# Patient Record
Sex: Female | Born: 1985 | Race: Black or African American | Hispanic: No | Marital: Single | Smoking: Never smoker
Health system: Southern US, Community
[De-identification: ages and names within clinical notes are randomized; demographics above are authoritative.]

---

## 2008-01-25 ENCOUNTER — Other Ambulatory Visit: Admission: RE | Admit: 2008-01-25 | Discharge: 2008-01-25 | Payer: Self-pay | Admitting: Obstetrics and Gynecology

## 2008-02-20 ENCOUNTER — Emergency Department (HOSPITAL_COMMUNITY): Admission: EM | Admit: 2008-02-20 | Discharge: 2008-02-20 | Payer: Self-pay | Admitting: *Deleted
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
## 2015-10-13 ENCOUNTER — Emergency Department: Payer: BC Managed Care – PPO

## 2015-10-13 ENCOUNTER — Emergency Department
Admission: EM | Admit: 2015-10-13 | Discharge: 2015-10-13 | Disposition: A | Discharge status: Home / Self Care | Payer: BC Managed Care – PPO | Attending: Emergency Medicine | Admitting: Emergency Medicine

## 2015-10-13 DIAGNOSIS — M65221 Calcific tendinitis, right upper arm: Secondary | ICD-10-CM | POA: Insufficient documentation

## 2015-10-13 LAB — CBC AND DIFFERENTIAL
Basophils Absolute Automated: 0.02 10*3/uL (ref 0.00–0.20)
Basophils Automated: 0 %
Eosinophils Absolute Automated: 0.08 10*3/uL (ref 0.00–0.70)
Eosinophils Automated: 2 %
Hematocrit: 44 % (ref 37.0–47.0)
Hgb: 14.9 g/dL (ref 12.0–16.0)
Immature Granulocytes Absolute: 0.01 10*3/uL
Immature Granulocytes: 0 %
Lymphocytes Absolute Automated: 2.1 10*3/uL (ref 0.50–4.40)
Lymphocytes Automated: 41 %
MCH: 29.6 pg (ref 28.0–32.0)
MCHC: 33.9 g/dL (ref 32.0–36.0)
MCV: 87.3 fL (ref 80.0–100.0)
MPV: 12.4 fL — ABNORMAL HIGH (ref 9.4–12.3)
Monocytes Absolute Automated: 0.6 10*3/uL (ref 0.00–1.20)
Monocytes: 12 %
Neutrophils Absolute: 2.28 10*3/uL (ref 1.80–8.10)
Neutrophils: 45 %
Nucleated RBC: 0 /100 WBC (ref 0–1)
Platelets: 228 10*3/uL (ref 140–400)
RBC: 5.04 10*6/uL (ref 4.20–5.40)
RDW: 13 % (ref 12–15)
WBC: 5.08 10*3/uL (ref 3.50–10.80)

## 2015-10-13 LAB — BASIC METABOLIC PANEL
Anion Gap: 14 (ref 5.0–15.0)
BUN: 9 mg/dL (ref 7–19)
CO2: 19 mEq/L — ABNORMAL LOW (ref 22–29)
Calcium: 9.3 mg/dL (ref 8.5–10.5)
Chloride: 110 mEq/L (ref 100–111)
Creatinine: 1 mg/dL (ref 0.6–1.0)
Glucose: 98 mg/dL (ref 70–100)
Potassium: 4.3 mEq/L (ref 3.5–5.1)
Sodium: 143 mEq/L (ref 136–145)

## 2015-10-13 LAB — GFR: EGFR: >60

## 2015-10-13 LAB — SEDIMENTATION RATE: Sed Rate: 12 mm/Hr (ref 0–20)

## 2015-10-13 LAB — URIC ACID: Uric acid: 3.9 mg/dL (ref 2.6–6.0)

## 2015-10-13 MED ORDER — TRAMADOL HCL 50 MG PO TABS
50.0000 mg | ORAL_TABLET | Freq: Four times a day (QID) | ORAL | Status: DC | PRN
Start: 2015-10-13 — End: 2017-10-07

## 2015-10-13 MED ORDER — TRAMADOL HCL 50 MG PO TABS
50.0000 mg | ORAL_TABLET | Freq: Once | ORAL | Status: AC
Start: 2015-10-13 — End: 2015-10-13
  Administered 2015-10-13: 50 mg via ORAL
  Filled 2015-10-13: qty 1

## 2015-10-13 NOTE — Discharge Instructions (Signed)
Dear Leah Garner:    I appreciate your choosing the [facility] for your healthcare needs, and hope your visit today was EXCELLENT.    Instructions:  Please follow-up with Dr. Leavy Cella (orthopedist) in 2-3 days.     Return to the Emergency Department for any worsening symptoms or concerns.    Below is some information that our patients often find helpful.    We wish you good health and please do not hesitate to contact us if we can ever be of any assistance.    Sincerely,  Dorethea Clan, MD  [facility]    ________________________________________________________________    If you do not continue to improve or your condition worsens, please contact your doctor or return immediately to the Emergency Department.    Thank you for choosing [facility] for your emergency care needs.  We strive to provide EXCELLENT care to you and your family.      DOCTOR REFERRALS  Call [phone] if you need any further referrals and we can help you find a primary care doctor or specialist.  Also, available online at:  [email or web address]    YOUR CONTACT INFORMATION  Before leaving please check with registration to make sure we have an up-to-date contact number.  You can call registration at [phone] to update your information.  For questions about your hospital bill, please call [phone].  For questions about your Emergency Dept Physician bill please call [phone].      FREE HEALTH SERVICES  If you need help with health or social services, please call 2-1-1 for a free referral to resources in your area.  2-1-1 is a free service connecting people with information on health insurance, free clinics, pregnancy, mental health, dental care, food assistance, housing, and substance abuse counseling.  Also, available online at:  http://www.[facility].org    MEDICAL RECORDS AND TESTS  Certain laboratory test results do not come back the  same day, for example urine cultures.   We will contact you if other important findings are noted.  Radiology films are often reviewed again to ensure accuracy.  If there is any discrepancy, we will notify you.      Please call [phone] to pick up a complimentary CD of any radiology studies performed.  If you or your doctor would like to request a copy of your medical records, please call [phone].      ORTHOPEDIC INJURY   Please know that significant injuries can exist even when an initial x-ray is read as normal or negative.  This can occur because some fractures (broken bones) are not initially visible on x-rays.  For this reason, close outpatient follow-up with your primary care doctor or bone specialist (orthopedist) is required.    MEDICATIONS AND FOLLOWUP  Please be aware that some prescription medications can cause drowsiness.  Use caution when driving or operating machinery.    The examination and treatment you have received in our Emergency Department is provided on an emergency basis, and is not intended to be a substitute for your primary care physician.  It is important that your doctor checks you again and that you report any new or remaining problems at that time.      24 HOUR PHARMACIES  [facility] - [address] [place], [place] [address] (1.4 miles, 7 minutes)  [facility] - [address] [place], [place] [address] (6.5 miles, 13 minutes)  Handout with directions  available on request

## 2015-10-13 NOTE — ED Notes (Signed)
Pt c/o increasing right elbow pain since Friday. Denies any injury. Pt states took a muscle relaxant with no improvement.

## 2015-10-13 NOTE — ED Provider Notes (Signed)
Physician/Midlevel provider first contact with patient: 10/13/15 1808         [facility] EMERGENCY DEPARTMENT HISTORY AND PHYSICAL EXAM    Patient Name: Leah Garner, Leah Garner  Encounter Date:  10/13/2015  Rendering Provider: Dorethea Clan, MD  Patient DOB:  11-Dec-1985  MRN:  [id number]    History of Presenting Illness     Historian: Patient    30 y.o. female nonsmoker o/w healthy p/w progressively worsening right elbow pain for the past 2 days, worsening significantly today.  She first noticed the pain when she woke from sleep 2 days ago.  She initially thought she had slept on it wrong, but pain has persisted and worsened.  Pain is worse with movement, especially bending at the elbow.  She took a muscle relaxer PTA w/o relief.  No known injury. Patient is on hormonal birth control.  Her aunt has a h/o gout.      PMD:  Pcp, [facility], MD    Past Medical History     History reviewed. No pertinent past medical history.    Past Surgical History     History reviewed. No pertinent past surgical history.    Family History     No family history on file.    Social History     Social History     Social History   . Marital Status: Single     Spouse Name: N/A   . Number of Children: N/A   . Years of Education: N/A     Social History Main Topics   . Smoking status: Never Smoker    . Smokeless tobacco: Not on file   . Alcohol Use: Yes      Comment: socially   . Drug Use: Not on file   . Sexual Activity: Not on file     Other Topics Concern   . Not on file     Social History Narrative   . No narrative on file       Home Medications     Home medications reviewed by ED MD     Previous Medications    DESOGESTREL-ETHINYL ESTRADIOL (KARIVA) 0.15-0.02/0.01 MG (21/5) PER TABLET    Take 1 tablet by mouth daily.         Review of Systems     MS:  +Right elbow pain  All other systems reviewed and negative    Physical Exam     BP 138/84 mmHg  Pulse 103  Temp(Src) 98.9 F (37.2 C)  Resp 18  Ht 5\' 6"  (1.676 m)  Wt 104.327 kg   BMI 37.14 kg/m2  SpO2 100%  LMP 10/04/2015 (Exact Date)    CONSTITUTIONAL   Vital signs reviewed, Well appearing, Patient appears comfortable.  Alert and oriented X 3, Patient has no pain distress.  HEAD   Atraumatic, Normocephalic.  UPPER EXTREMITY  Left elbow tenderness. ?Slight increase in warmth. Pain with flexion.  NEURO  No focal motor deficits, Speech normal. Cranial nerves grossly normal.  SKIN   Skin is warm, Skin is dry, Skin is normal color.    ED Medications Administered     ED Medication Orders     None          Orders Placed During This Encounter     Orders Placed This Encounter   Procedures   . Splint- Upper Extremity   . Elbow Right AP Lateral and Obliques   . Basic Metabolic Panel (BMP)   . CBC with Differential   .  Sedimentation rate (ESR)   . Uric acid   . GFR       Diagnostic Study Results     The results of the diagnostic studies below were reviewed by the ED provider:    Labs  Results     Procedure Component Value Units Date/Time    GFR [[id number]] Collected:  10/13/15 1913     EGFR >60.0 Updated:  10/13/15 1935    Basic Metabolic Panel (BMP) [[id number]]  (Abnormal) Collected:  10/13/15 1913    Specimen Information:  Blood Updated:  10/13/15 1935     Glucose 98 mg/dL      BUN 9 mg/dL      Creatinine 1.0 mg/dL      Calcium 9.3 mg/dL      Sodium 621 mEq/L      Potassium 4.3 mEq/L      Chloride 110 mEq/L      CO2 19 (L) mEq/L      Anion Gap 14.0     Uric acid [[id number]] Collected:  10/13/15 1913    Specimen Information:  Blood Updated:  10/13/15 1935     Uric acid 3.9 mg/dL     CBC with Differential [[id number]]  (Abnormal) Collected:  10/13/15 1913    Specimen Information:  Blood from Blood Updated:  10/13/15 1919     WBC 5.08 x10 3/uL      Hgb 14.9 g/dL      Hematocrit 32.4 %      Platelets 228 x10 3/uL      RBC 5.04 x10 6/uL      MCV 87.3 fL      MCH 29.6 pg      MCHC 33.9 g/dL      RDW 13 %      MPV 12.4 (H) fL      Neutrophils 45 %      Lymphocytes Automated 41 %      Monocytes 12 %       Eosinophils Automated 2 %      Basophils Automated 0 %      Immature Granulocyte 0 %      Nucleated RBC 0 /100 WBC      Neutrophils Absolute 2.28 x10 3/uL      Abs Lymph Automated 2.10 x10 3/uL      Abs Mono Automated 0.60 x10 3/uL      Abs Eos Automated 0.08 x10 3/uL      Absolute Baso Automated 0.02 x10 3/uL      Absolute Immature Granulocyte 0.01 x10 3/uL     Sedimentation rate (ESR) [[id number]] Collected:  10/13/15 1913    Specimen Information:  Blood Updated:  10/13/15 1918          Radiologic Studies  Radiology Results (24 Hour)     Procedure Component Value Units Date/Time    Elbow Right AP Lateral and Obliques [[id number]] Collected:  10/13/15 1956    Order Status:  Completed Updated:  10/13/15 2001    Narrative:      History: Right elbow pain.    AP, lateral, and oblique views of the right elbow demonstrate no  fracture, dislocation, effusion, or significant bone lesion. There is a  small amount of punctate calcification within the soft tissues of the  olecranon this could indicate calcific tendinitis.    Joint spaces preserved.      Impression:       Calcification within the soft tissues at the tip of the  olecranon possible calcific tendinitis.  Otherwise unremarkable.        Leah Rakes, MD   10/13/2015 7:57 PM            Scribe and MD Attestations     I, Leah Clan, MD, personally performed the services documented. Leah Garner is scribing for me on Leah Garner. I reviewed and confirm the accuracy of the information in this medical record.    I, Leah [employer], am serving as a Neurosurgeon to document services personally performed by Leah Clan, MD, based on the provider's statements to me.     Credentials: Leah Garner, scribe    Rendering Provider: Dorethea Clan, MD    Monitors, EKG, Critical Care, and Splints     EKG (interpreted by ED physician):     Critical Care:   Splint check:      MDM and Clinical Notes     Notes:    Consults:    Diagnosis and  Disposition     Clinical Impression  1. Calcific tendonitis of right upper arm        Disposition  ED Disposition     Discharge Buel Ream discharge to home/self care.    Condition at disposition: Stable            Prescriptions       New Prescriptions    TRAMADOL (ULTRAM) 50 MG TABLET    Take 1 tablet (50 mg total) by mouth every 6 (six) hours as needed for Pain. Do not drive or operate machinery while taking this medication               Leah Clan, MD  10/14/15 2358

## 2019-09-30 ENCOUNTER — Encounter (INDEPENDENT_AMBULATORY_CARE_PROVIDER_SITE_OTHER): Payer: Self-pay

## 2019-10-05 ENCOUNTER — Ambulatory Visit (INDEPENDENT_AMBULATORY_CARE_PROVIDER_SITE_OTHER): Payer: BC Managed Care – PPO

## 2019-10-05 DIAGNOSIS — Z23 Encounter for immunization: Secondary | ICD-10-CM

## 2019-10-26 ENCOUNTER — Encounter (INDEPENDENT_AMBULATORY_CARE_PROVIDER_SITE_OTHER): Payer: Self-pay

## 2019-11-01 ENCOUNTER — Ambulatory Visit (INDEPENDENT_AMBULATORY_CARE_PROVIDER_SITE_OTHER): Payer: BC Managed Care – PPO

## 2019-11-01 DIAGNOSIS — Z23 Encounter for immunization: Secondary | ICD-10-CM

## 2022-07-27 ENCOUNTER — Ambulatory Visit
Admission: EM | Admit: 2022-07-27 | Discharge: 2022-07-27 | Disposition: A | Discharge status: Home / Self Care | Payer: BC Managed Care – PPO | Attending: Emergency Medicine | Admitting: Emergency Medicine

## 2022-07-27 DIAGNOSIS — N760 Acute vaginitis: Secondary | ICD-10-CM | POA: Diagnosis not present

## 2022-07-27 DIAGNOSIS — B9689 Other specified bacterial agents as the cause of diseases classified elsewhere: Secondary | ICD-10-CM | POA: Diagnosis not present

## 2022-07-27 DIAGNOSIS — Z113 Encounter for screening for infections with a predominantly sexual mode of transmission: Secondary | ICD-10-CM | POA: Insufficient documentation

## 2022-07-27 DIAGNOSIS — Z3202 Encounter for pregnancy test, result negative: Secondary | ICD-10-CM

## 2022-07-27 DIAGNOSIS — R300 Dysuria: Secondary | ICD-10-CM | POA: Diagnosis present

## 2022-07-27 DIAGNOSIS — R350 Frequency of micturition: Secondary | ICD-10-CM | POA: Diagnosis not present

## 2022-07-27 DIAGNOSIS — R1030 Lower abdominal pain, unspecified: Secondary | ICD-10-CM | POA: Insufficient documentation

## 2022-07-27 LAB — POCT URINALYSIS DIP (MANUAL ENTRY)
Bilirubin, UA: NEGATIVE
Glucose, UA: NEGATIVE mg/dL
Ketones, POC UA: NEGATIVE mg/dL
Nitrite, UA: NEGATIVE
Protein Ur, POC: 30 mg/dL — AB
Spec Grav, UA: 1.015 (ref 1.010–1.025)
Urobilinogen, UA: 0.2 E.U./dL
pH, UA: 7 (ref 5.0–8.0)

## 2022-07-27 LAB — POCT URINE PREGNANCY: Preg Test, Ur: NEGATIVE

## 2022-07-27 MED ORDER — NITROFURANTOIN MONOHYD MACRO 100 MG PO CAPS: 100.0000 mg | ORAL_CAPSULE | Freq: Two times a day (BID) | ORAL | 0 refills | Status: AC

## 2022-07-27 NOTE — Discharge Instructions (Addendum)
Take the antibiotic as directed.  The urine culture is pending.  We will call you if it shows the need to change or discontinue your antibiotic.   ° °Your vaginal tests are pending.  If your test results are positive, we will call you.  Do not have sexual activity for at least 7 days.   ° °Follow up with your primary care provider if your symptoms are not improving.   ° °

## 2022-07-27 NOTE — ED Triage Notes (Addendum)
Patient to Urgent Care with complaints of strong smelling urine and urinary frequency. Reports some urinary discomfort. Symptoms started four days ago.   Denies any known fevers.

## 2022-07-27 NOTE — ED Provider Notes (Signed)
Roderic Palau    CSN: [id number] Arrival date & time: 07/27/22  1553      History   Chief Complaint Chief Complaint  Patient presents with   Dysuria    HPI Becky Contreras is a 37 y.o. female.  Patient presents with malodorous urine, dysuria, urinary frequency, lower abdominal cramping x 4 days.  No fever, chills, abdominal pain, hematuria, flank pain, vaginal discharge, pelvic pain, or other symptoms.  No treatment at home.  Patient also requests vaginal testing.  LMP: 07/23/2022.   The history is provided by the patient and medical records.    History reviewed. No pertinent past medical history.  There are no problems to display for this patient.   History reviewed. No pertinent surgical history.  OB History   No obstetric history on file.      Home Medications    Prior to Admission medications   Medication Sig Start Date End Date Taking? Authorizing Provider  BLISOVI 24 FE 1-20 MG-MCG(24) tablet Take 1 tablet by mouth daily. 07/22/22  Yes [provider]  nitrofurantoin, macrocrystal-monohydrate, (MACROBID) 100 MG capsule Take 1 capsule (100 mg total) by mouth 2 (two) times daily. 07/27/22  Yes Sharion Balloon, NP    Family History No family history on file.  Social History Social History   Tobacco Use   Smoking status: Never   Smokeless tobacco: Never  Substance Use Topics   Alcohol use: Yes   Drug use: Never     Allergies   Amoxicillin   Review of Systems Review of Systems  Constitutional:  Negative for chills and fever.  Gastrointestinal:  Positive for abdominal pain. Negative for constipation, diarrhea, nausea and vomiting.  Genitourinary:  Positive for dysuria and frequency. Negative for flank pain, hematuria, pelvic pain and vaginal discharge.  Skin:  Negative for rash.  All other systems reviewed and are negative.    Physical Exam Triage Vital Signs ED Triage Vitals  Enc Vitals Group     BP      Pulse      Resp       Temp      Temp src      SpO2      Weight      Height      Head Circumference      Peak Flow      Pain Score      Pain Loc      Pain Edu?      Excl. in [place]?    No data found.  Updated Vital Signs BP 136/88   Pulse 97   Temp 98.1 F (36.7 C)   Resp 18   LMP 07/23/2022   SpO2 98%   Visual Acuity Right Eye Distance:   Left Eye Distance:   Bilateral Distance:    Right Eye Near:   Left Eye Near:    Bilateral Near:     Physical Exam Vitals and nursing note reviewed.  Constitutional:      General: She is not in acute distress.    Appearance: She is well-developed. She is not ill-appearing.  HENT:     Mouth/Throat:     Mouth: Mucous membranes are moist.  Cardiovascular:     Rate and Rhythm: Normal rate and regular rhythm.     Heart sounds: Normal heart sounds.  Pulmonary:     Effort: Pulmonary effort is normal. No respiratory distress.     Breath sounds: Normal breath sounds.  Abdominal:  General: Bowel sounds are normal.     Palpations: Abdomen is soft.     Tenderness: There is no abdominal tenderness. There is no right CVA tenderness, left CVA tenderness, guarding or rebound.  Musculoskeletal:     Cervical back: Neck supple.  Skin:    General: Skin is warm and dry.  Neurological:     Mental Status: She is alert.  Psychiatric:        Mood and Affect: Mood normal.        Behavior: Behavior normal.      UC Treatments / Results  Labs (all labs ordered are listed, but only abnormal results are displayed) Labs Reviewed  POCT URINALYSIS DIP (MANUAL ENTRY) - Abnormal; Notable for the following components:      Result Value   Clarity, UA cloudy (*)    Blood, UA large (*)    Protein Ur, POC =30 (*)    Leukocytes, UA Large (3+) (*)    All other components within normal limits  URINE CULTURE  POCT URINE PREGNANCY  CERVICOVAGINAL ANCILLARY ONLY    EKG   Radiology No results found.  Procedures Procedures (including critical care time)  Medications  Ordered in UC Medications - No data to display  Initial Impression / Assessment and Plan / UC Course  I have reviewed the triage vital signs and the nursing notes.  Pertinent labs & imaging results that were available during my care of the patient were reviewed by me and considered in my medical decision making (see chart for details).   Dysuria, STD screening, negative pregnancy test, lower abdominal pain.  Treating with Macrobid. Urine culture pending. Discussed with patient that we will call her if the urine culture shows the need to change or discontinue the antibiotic.  Patient obtained vaginal self swab for testing.  Discussed that we will call if test results are positive.  Instructed patient to abstain from sexual activity for at least 7 days.  Instructed her to follow-up with her PCP if her symptoms are not improving.  Patient agrees to plan of care.   Final Clinical Impressions(s) / UC Diagnoses   Final diagnoses:  Dysuria  Screening for STD (sexually transmitted disease)  Negative pregnancy test  Lower abdominal pain     Discharge Instructions      Take the antibiotic as directed.  The urine culture is pending.  We will call you if it shows the need to change or discontinue your antibiotic.     Your vaginal tests are pending.  If your test results are positive, we will call you.  Do not have sexual activity for at least 7 days.    Follow up with your primary care provider if your symptoms are not improving.        ED Prescriptions     Medication Sig Dispense Auth. Provider   nitrofurantoin, macrocrystal-monohydrate, (MACROBID) 100 MG capsule Take 1 capsule (100 mg total) by mouth 2 (two) times daily. 10 capsule Sharion Balloon, NP      PDMP not reviewed this encounter.   Sharion Balloon, NP 07/27/22 [phone]

## 2022-07-28 LAB — URINE CULTURE: Culture: >=100000 — AB

## 2022-07-28 LAB — CERVICOVAGINAL ANCILLARY ONLY
Bacterial Vaginitis (gardnerella): POSITIVE — AB
Candida Glabrata: NEGATIVE
Candida Vaginitis: NEGATIVE
Chlamydia: NEGATIVE
Comment: NEGATIVE
Comment: NEGATIVE
Comment: NEGATIVE
Comment: NEGATIVE
Comment: NEGATIVE
Comment: NORMAL
Neisseria Gonorrhea: NEGATIVE
Trichomonas: NEGATIVE

## 2022-07-29 ENCOUNTER — Telehealth (HOSPITAL_COMMUNITY): Payer: Self-pay | Admitting: Emergency Medicine

## 2022-07-29 LAB — URINE CULTURE

## 2022-07-29 MED ORDER — METRONIDAZOLE 500 MG PO TABS: 500.0000 mg | ORAL_TABLET | Freq: Two times a day (BID) | ORAL | 0 refills | Status: AC

## 2022-07-29 MED ORDER — FLUCONAZOLE 150 MG PO TABS: 150.0000 mg | ORAL_TABLET | Freq: Once | ORAL | 0 refills | Status: AC
